# Patient Record
Sex: Male | Born: 1980 | Race: Black or African American | Hispanic: No | Marital: Single | Smoking: Current every day smoker
Health system: Southern US, Community
[De-identification: ages and names within clinical notes are randomized; demographics above are authoritative.]

## PROBLEM LIST (undated history)

## (undated) DIAGNOSIS — I10 Essential (primary) hypertension: Secondary | ICD-10-CM

## (undated) DIAGNOSIS — F419 Anxiety disorder, unspecified: Secondary | ICD-10-CM

## (undated) DIAGNOSIS — F329 Major depressive disorder, single episode, unspecified: Secondary | ICD-10-CM

## (undated) DIAGNOSIS — F32A Depression, unspecified: Secondary | ICD-10-CM

---

## 2006-01-24 ENCOUNTER — Emergency Department (HOSPITAL_COMMUNITY): Admission: EM | Admit: 2006-01-24 | Discharge: 2006-01-24 | Payer: Self-pay | Admitting: Emergency Medicine

## 2007-08-11 ENCOUNTER — Emergency Department (HOSPITAL_COMMUNITY): Admission: EM | Admit: 2007-08-11 | Discharge: 2007-08-11 | Payer: Self-pay | Admitting: Emergency Medicine

## 2007-10-14 ENCOUNTER — Emergency Department (HOSPITAL_COMMUNITY): Admission: EM | Admit: 2007-10-14 | Discharge: 2007-10-14 | Payer: Self-pay | Admitting: Emergency Medicine

## 2007-12-04 ENCOUNTER — Ambulatory Visit: Payer: Self-pay | Admitting: *Deleted

## 2007-12-06 ENCOUNTER — Ambulatory Visit: Payer: Self-pay | Admitting: *Deleted

## 2007-12-11 ENCOUNTER — Ambulatory Visit: Payer: Self-pay | Admitting: *Deleted

## 2007-12-18 ENCOUNTER — Ambulatory Visit: Payer: Self-pay | Admitting: *Deleted
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
## 2015-06-28 ENCOUNTER — Encounter (HOSPITAL_COMMUNITY): Payer: Self-pay | Admitting: *Deleted

## 2015-06-28 ENCOUNTER — Emergency Department (HOSPITAL_COMMUNITY)
Admission: EM | Admit: 2015-06-28 | Discharge: 2015-06-28 | Disposition: A | Discharge status: Home / Self Care | Payer: Self-pay | Attending: Emergency Medicine | Admitting: Emergency Medicine

## 2015-06-28 DIAGNOSIS — I10 Essential (primary) hypertension: Secondary | ICD-10-CM | POA: Insufficient documentation

## 2015-06-28 DIAGNOSIS — F17210 Nicotine dependence, cigarettes, uncomplicated: Secondary | ICD-10-CM | POA: Insufficient documentation

## 2015-06-28 DIAGNOSIS — Z8659 Personal history of other mental and behavioral disorders: Secondary | ICD-10-CM | POA: Insufficient documentation

## 2015-06-28 HISTORY — DX: Major depressive disorder, single episode, unspecified: F32.9

## 2015-06-28 HISTORY — DX: Essential (primary) hypertension: I10

## 2015-06-28 HISTORY — DX: Depression, unspecified: F32.A

## 2015-06-28 HISTORY — DX: Anxiety disorder, unspecified: F41.9

## 2015-06-28 NOTE — ED Provider Notes (Signed)
CSN: [id number]     Arrival date & time 06/28/15  1720 History   First MD Initiated Contact with Patient 06/28/15 2243     Chief Complaint  Patient presents with  . Hypertension      Patient is a 35 y.o. male presenting with hypertension. The history is provided by the patient. No language interpreter was used.  Hypertension   Rocky Gladden is a 35 y.o. male who presents to the Emergency Department complaining of HTN.  He has a hx/o HTN and is currently followed at [facility] for HTN.  He went today for follow up appointment for his HTN to recheck his potassium because it was low on his last visit.  He started taking verapamil and HCTZ two months ago.  He has not been on any BP meds for the last one to one and a half months.  Today he prescribed losartan but this has not been filled yet.  He had a lab draw today at urgent care but does not have the results.  He is currently asymptomatic. Denies chest pain, headache, numbness, weakness, shortness of breath. He was referred to the ED from clinic due to his elevated blood pressure.  Past Medical History  Diagnosis Date  . Hypertension   . Anxiety   . Depression    History reviewed. No pertinent past surgical history. No family history on file. Social History  Substance Use Topics  . Smoking status: Current Every Day Smoker    Types: Cigars  . Smokeless tobacco: None  . Alcohol Use: 3.6 oz/week    6 Cans of beer per week    Review of Systems  All other systems reviewed and are negative.     Allergies  Review of patient's allergies indicates no known allergies.  Home Medications   Prior to Admission medications   Not on File   BP 180/118 mmHg  Pulse 75  Temp(Src) 98 F (36.7 C) (Oral)  Resp 16  Ht  (1.727 m)  Wt 220 lb (99.791 kg)  BMI 33.46 kg/m2  SpO2 100% Physical Exam  Constitutional: He is oriented to person, place, and time. He appears well-developed and well-nourished.  HENT:  Head: Normocephalic and  atraumatic.  Eyes: EOM are normal. Pupils are equal, round, and reactive to light.  Cardiovascular: Normal rate and regular rhythm.   No murmur heard. Pulmonary/Chest: Effort normal and breath sounds normal. No respiratory distress.  Abdominal: Soft. There is no tenderness. There is no rebound and no guarding.  Musculoskeletal: He exhibits no edema or tenderness.  Neurological: He is alert and oriented to person, place, and time.  Skin: Skin is warm and dry.  Psychiatric: He has a normal mood and affect. His behavior is normal.  Nursing note and vitals reviewed.   ED Course  Procedures (including critical care time) Labs Review Labs Reviewed  I-STAT CHEM 8, ED    Imaging Review No results found. I have personally reviewed and evaluated these images and lab results as part of my medical decision-making.   EKG Interpretation   Date/Time:  Monday June 28 2015 17:46:15 EDT Ventricular Rate:  83 PR Interval:  154 QRS Duration: 86 QT Interval:  384 QTC Calculation: 451 R Axis:   29 Text Interpretation:  Normal sinus rhythm Cannot rule out Anterior infarct  , age undetermined T wave abnormality, consider lateral ischemia Abnormal  ECG Confirmed by Lincoln Brigham [phone]) on 06/28/2015 10:26:37 PM      MDM   Final diagnoses:  Essential hypertension    Patient here for assymptomatic hypertension. He is in no distress on examination with no systemic symptoms. Discussed the patient taking his losartan as prescribed. He had blood drawn at urgent care today and declines additional blood draw in the emergency department. Home care and return precautions discussed for hypertension.    Tilden FossaElizabeth Alfred Eckley, MD 06/28/15 [phone]

## 2015-06-28 NOTE — Discharge Instructions (Signed)
Take your Losartan as prescribed.  Get rechecked immediately if you develop headache, chest pain, shortness of breath, numbness, weakness or new concerning symptoms.     Hypertension Hypertension, commonly called high blood pressure, is when the force of blood pumping through your arteries is too strong. Your arteries are the blood vessels that carry blood from your heart throughout your body. A blood pressure reading consists of a higher number over a lower number, such as 110/72. The higher number (systolic) is the pressure inside your arteries when your heart pumps. The lower number (diastolic) is the pressure inside your arteries when your heart relaxes. Ideally you want your blood pressure below 120/80. Hypertension forces your heart to work harder to pump blood. Your arteries may become narrow or stiff. Having untreated or uncontrolled hypertension can cause heart attack, stroke, kidney disease, and other problems. RISK FACTORS Some risk factors for high blood pressure are controllable. Others are not.  Risk factors you cannot control include:   Race. You may be at higher risk if you are African American.  Age. Risk increases with age.  Gender. Men are at higher risk than women before age 73 years. After age 60, women are at higher risk than men. Risk factors you can control include:  Not getting enough exercise or physical activity.  Being overweight.  Getting too much fat, sugar, calories, or salt in your diet.  Drinking too much alcohol. SIGNS AND SYMPTOMS Hypertension does not usually cause signs or symptoms. Extremely high blood pressure (hypertensive crisis) may cause headache, anxiety, shortness of breath, and nosebleed. DIAGNOSIS To check if you have hypertension, your health care provider will measure your blood pressure while you are seated, with your arm held at the level of your heart. It should be measured at least twice using the same arm. Certain conditions can cause a  difference in blood pressure between your right and left arms. A blood pressure reading that is higher than normal on one occasion does not mean that you need treatment. If it is not clear whether you have high blood pressure, you may be asked to return on a different day to have your blood pressure checked again. Or, you may be asked to monitor your blood pressure at home for 1 or more weeks. TREATMENT Treating high blood pressure includes making lifestyle changes and possibly taking medicine. Living a healthy lifestyle can help lower high blood pressure. You may need to change some of your habits. Lifestyle changes may include:  Following the DASH diet. This diet is high in fruits, vegetables, and whole grains. It is low in salt, red meat, and added sugars.  Keep your sodium intake below 2,300 mg per day.  Getting at least 30-45 minutes of aerobic exercise at least 4 times per week.  Losing weight if necessary.  Not smoking.  Limiting alcoholic beverages.  Learning ways to reduce stress. Your health care provider may prescribe medicine if lifestyle changes are not enough to get your blood pressure under control, and if one of the following is true:  You are 77-74 years of age and your systolic blood pressure is above 140.  You are 58 years of age or older, and your systolic blood pressure is above 150.  Your diastolic blood pressure is above 90.  You have diabetes, and your systolic blood pressure is over 140 or your diastolic blood pressure is over 90.  You have kidney disease and your blood pressure is above 140/90.  You have heart  disease and your blood pressure is above 140/90. Your personal target blood pressure may vary depending on your medical conditions, your age, and other factors. HOME CARE INSTRUCTIONS  Have your blood pressure rechecked as directed by your health care provider.   Take medicines only as directed by your health care provider. Follow the directions  carefully. Blood pressure medicines must be taken as prescribed. The medicine does not work as well when you skip doses. Skipping doses also puts you at risk for problems.  Do not smoke.   Monitor your blood pressure at home as directed by your health care provider. SEEK MEDICAL CARE IF:   You think you are having a reaction to medicines taken.  You have recurrent headaches or feel dizzy.  You have swelling in your ankles.  You have trouble with your vision. SEEK IMMEDIATE MEDICAL CARE IF:  You develop a severe headache or confusion.  You have unusual weakness, numbness, or feel faint.  You have severe chest or abdominal pain.  You vomit repeatedly.  You have trouble breathing. MAKE SURE YOU:   Understand these instructions.  Will watch your condition.  Will get help right away if you are not doing well or get worse.   This information is not intended to replace advice given to you by your health care provider. Make sure you discuss any questions you have with your health care provider.   Document Released: 03/27/2005 Document Revised: 08/11/2014 Document Reviewed: 01/17/2013 Elsevier Interactive Patient Education 2016 Else[employer] Inc[employer]  Managing Your High Blood Pressure Blood pressure is a measurement of how forceful your blood is pressing against the walls of the arteries. Arteries are muscular tubes within the circulatory system. Blood pressure does not stay the same. Blood pressure rises when you are active, excited, or nervous; and it lowers during sleep and relaxation. If the numbers measuring your blood pressure stay above normal most of the time, you are at risk for health problems. High blood pressure (hypertension) is a long-term (chronic) condition in which blood pressure is elevated. A blood pressure reading is recorded as two numbers, such as 120 over 80 (or 120/80). The first, higher number is called the systolic pressure. It is a measure of the pressure in your  arteries as the heart beats. The second, lower number is called the diastolic pressure. It is a measure of the pressure in your arteries as the heart relaxes between beats.  Keeping your blood pressure in a normal range is important to your overall health and prevention of health problems, such as heart disease and stroke. When your blood pressure is uncontrolled, your heart has to work harder than normal. High blood pressure is a very common condition in adults because blood pressure tends to rise with age. Men and women are equally likely to have hypertension but at different times in life. Before age 22, men are more likely to have hypertension. After 34 years of age, women are more likely to have it. Hypertension is especially common in African Americans. This condition often has no signs or symptoms. The cause of the condition is usually not known. Your caregiver can help you come up with a plan to keep your blood pressure in a normal, healthy range. BLOOD PRESSURE STAGES Blood pressure is classified into four stages: normal, prehypertension, stage 1, and stage 2. Your blood pressure reading will be used to determine what type of treatment, if any, is necessary. Appropriate treatment options are tied to these four stages:  Normal  Systolic pressure (mm Hg): below 120.  Diastolic pressure (mm Hg): below 80. Prehypertension  Systolic pressure (mm Hg): 120 to 139.  Diastolic pressure (mm Hg): 80 to 89. Stage1  Systolic pressure (mm Hg): 140 to 159.  Diastolic pressure (mm Hg): 90 to 99. Stage2  Systolic pressure (mm Hg): 160 or above.  Diastolic pressure (mm Hg): 100 or above. RISKS RELATED TO HIGH BLOOD PRESSURE Managing your blood pressure is an important responsibility. Uncontrolled high blood pressure can lead to:  A heart attack.  A stroke.  A weakened blood vessel (aneurysm).  Heart failure.  Kidney damage.  Eye damage.  Metabolic syndrome.  Memory and concentration  problems. HOW TO MANAGE YOUR BLOOD PRESSURE Blood pressure can be managed effectively with lifestyle changes and medicines (if needed). Your caregiver will help you come up with a plan to bring your blood pressure within a normal range. Your plan should include the following: Education  Read all information provided by your caregivers about how to control blood pressure.  Educate yourself on the latest guidelines and treatment recommendations. New research is always being done to further define the risks and treatments for high blood pressure. Lifestylechanges  Control your weight.  Avoid smoking.  Stay physically active.  Reduce the amount of salt in your diet.  Reduce stress.  Control any chronic conditions, such as high cholesterol or diabetes.  Reduce your alcohol intake. Medicines  Several medicines (antihypertensive medicines) are available, if needed, to bring blood pressure within a normal range. Communication  Review all the medicines you take with your caregiver because there may be side effects or interactions.  Talk with your caregiver about your diet, exercise habits, and other lifestyle factors that may be contributing to high blood pressure.  See your caregiver regularly. Your caregiver can help you create and adjust your plan for managing high blood pressure. RECOMMENDATIONS FOR TREATMENT AND FOLLOW-UP  The following recommendations are based on current guidelines for managing high blood pressure in nonpregnant adults. Use these recommendations to identify the proper follow-up period or treatment option based on your blood pressure reading. You can discuss these options with your caregiver.  Systolic pressure of 120 to 139 or diastolic pressure of 80 to 89: Follow up with your caregiver as directed.  Systolic pressure of 140 to 160 or diastolic pressure of 90 to 100: Follow up with your caregiver within 2 months.  Systolic pressure above 160 or diastolic  pressure above 100: Follow up with your caregiver within 1 month.  Systolic pressure above 180 or diastolic pressure above 110: Consider antihypertensive therapy; follow up with your caregiver within 1 week.  Systolic pressure above 200 or diastolic pressure above 120: Begin antihypertensive therapy; follow up with your caregiver within 1 week.   This information is not intended to replace advice given to you by your health care provider. Make sure you discuss any questions you have with your health care provider.   Document Released: 12/20/2011 Document Reviewed: 12/20/2011 Elsevier Interactive Patient Education [employer]2016 [employer] Inc.

## 2015-06-28 NOTE — ED Notes (Signed)
Pt went to Urgent Care for a follow up after starting him on Verapamil. When pt went back today for medication renewal his BP was high. States he ran out of Verapamil and HCTZ. States last time he took Verapamil was 1.5 months ago. HCTZ he has not  Taken since the beginning of the month. Urgent care has not started pt on Losartan Potassium d/t low potassium from HCTZ.
